# Patient Record
Sex: Male | Born: 1995 | Race: White | Hispanic: No | Marital: Single | Smoking: Current every day smoker
Health system: Southern US, Community
[De-identification: ages and names within clinical notes are randomized; demographics above are authoritative.]

## PROBLEM LIST (undated history)

## (undated) DIAGNOSIS — F419 Anxiety disorder, unspecified: Secondary | ICD-10-CM

## (undated) DIAGNOSIS — F909 Attention-deficit hyperactivity disorder, unspecified type: Secondary | ICD-10-CM

## (undated) DIAGNOSIS — F32A Depression, unspecified: Secondary | ICD-10-CM

## (undated) DIAGNOSIS — F329 Major depressive disorder, single episode, unspecified: Secondary | ICD-10-CM

## (undated) HISTORY — DX: Anxiety disorder, unspecified: F41.9

## (undated) HISTORY — PX: WISDOM TOOTH EXTRACTION: SHX21

## (undated) HISTORY — DX: Major depressive disorder, single episode, unspecified: F32.9

## (undated) HISTORY — DX: Depression, unspecified: F32.A

## (undated) HISTORY — PX: NO PAST SURGERIES: SHX2092

---
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
## 2016-10-12 ENCOUNTER — Encounter (HOSPITAL_COMMUNITY): Payer: Self-pay | Admitting: Emergency Medicine

## 2016-10-12 ENCOUNTER — Ambulatory Visit (HOSPITAL_COMMUNITY)
Admission: EM | Admit: 2016-10-12 | Discharge: 2016-10-12 | Disposition: A | Discharge status: Home / Self Care | Payer: BLUE CROSS/BLUE SHIELD | Attending: Family Medicine | Admitting: Family Medicine

## 2016-10-12 ENCOUNTER — Ambulatory Visit (INDEPENDENT_AMBULATORY_CARE_PROVIDER_SITE_OTHER): Payer: BLUE CROSS/BLUE SHIELD

## 2016-10-12 DIAGNOSIS — R0782 Intercostal pain: Secondary | ICD-10-CM

## 2016-10-12 DIAGNOSIS — S2231XA Fracture of one rib, right side, initial encounter for closed fracture: Secondary | ICD-10-CM | POA: Diagnosis not present

## 2016-10-12 DIAGNOSIS — R0789 Other chest pain: Secondary | ICD-10-CM

## 2016-10-12 HISTORY — DX: Other chest pain: R07.89

## 2016-10-12 MED ORDER — HYDROCODONE-ACETAMINOPHEN 5-325 MG PO TABS: 1.0000 | ORAL_TABLET | Freq: Four times a day (QID) | ORAL | 0 refills | Status: DC | PRN

## 2016-10-12 MED ORDER — DICLOFENAC SODIUM 75 MG PO TBEC: 75.0000 mg | DELAYED_RELEASE_TABLET | Freq: Two times a day (BID) | ORAL | 1 refills | Status: DC
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
## 2016-11-07 DIAGNOSIS — M62830 Muscle spasm of back: Secondary | ICD-10-CM | POA: Diagnosis not present

## 2016-11-07 DIAGNOSIS — M9902 Segmental and somatic dysfunction of thoracic region: Secondary | ICD-10-CM | POA: Diagnosis not present

## 2016-11-07 DIAGNOSIS — R51 Headache: Secondary | ICD-10-CM | POA: Diagnosis not present

## 2016-11-07 DIAGNOSIS — M9901 Segmental and somatic dysfunction of cervical region: Secondary | ICD-10-CM | POA: Diagnosis not present

## 2016-12-29 DIAGNOSIS — M9901 Segmental and somatic dysfunction of cervical region: Secondary | ICD-10-CM | POA: Diagnosis not present

## 2016-12-29 DIAGNOSIS — M9902 Segmental and somatic dysfunction of thoracic region: Secondary | ICD-10-CM | POA: Diagnosis not present

## 2016-12-29 DIAGNOSIS — R51 Headache: Secondary | ICD-10-CM | POA: Diagnosis not present

## 2016-12-29 DIAGNOSIS — M62830 Muscle spasm of back: Secondary | ICD-10-CM | POA: Diagnosis not present

## 2017-02-13 DIAGNOSIS — Z720 Tobacco use: Secondary | ICD-10-CM | POA: Diagnosis not present

## 2017-02-13 DIAGNOSIS — J309 Allergic rhinitis, unspecified: Secondary | ICD-10-CM | POA: Diagnosis not present

## 2017-06-23 DIAGNOSIS — J9801 Acute bronchospasm: Secondary | ICD-10-CM | POA: Diagnosis not present

## 2017-06-23 DIAGNOSIS — J069 Acute upper respiratory infection, unspecified: Secondary | ICD-10-CM | POA: Diagnosis not present

## 2017-08-02 DIAGNOSIS — R509 Fever, unspecified: Secondary | ICD-10-CM | POA: Diagnosis not present

## 2017-08-02 DIAGNOSIS — J029 Acute pharyngitis, unspecified: Secondary | ICD-10-CM | POA: Diagnosis not present

## 2017-08-28 DIAGNOSIS — Z113 Encounter for screening for infections with a predominantly sexual mode of transmission: Secondary | ICD-10-CM | POA: Diagnosis not present

## 2017-08-31 DIAGNOSIS — J020 Streptococcal pharyngitis: Secondary | ICD-10-CM | POA: Diagnosis not present

## 2018-03-09 DIAGNOSIS — F419 Anxiety disorder, unspecified: Secondary | ICD-10-CM | POA: Diagnosis not present

## 2018-03-09 DIAGNOSIS — F322 Major depressive disorder, single episode, severe without psychotic features: Secondary | ICD-10-CM | POA: Diagnosis not present

## 2018-03-09 DIAGNOSIS — Z23 Encounter for immunization: Secondary | ICD-10-CM | POA: Diagnosis not present

## 2018-05-04 DIAGNOSIS — Z113 Encounter for screening for infections with a predominantly sexual mode of transmission: Secondary | ICD-10-CM | POA: Diagnosis not present

## 2018-05-04 DIAGNOSIS — F322 Major depressive disorder, single episode, severe without psychotic features: Secondary | ICD-10-CM | POA: Diagnosis not present

## 2018-07-31 ENCOUNTER — Other Ambulatory Visit: Payer: Self-pay | Admitting: Physician Assistant

## 2018-07-31 ENCOUNTER — Ambulatory Visit
Admission: RE | Admit: 2018-07-31 | Discharge: 2018-07-31 | Disposition: A | Discharge status: Home / Self Care | Payer: BLUE CROSS/BLUE SHIELD | Source: Ambulatory Visit | Attending: Physician Assistant | Admitting: Physician Assistant

## 2018-07-31 DIAGNOSIS — R059 Cough, unspecified: Secondary | ICD-10-CM

## 2018-07-31 DIAGNOSIS — F419 Anxiety disorder, unspecified: Secondary | ICD-10-CM | POA: Diagnosis not present

## 2018-07-31 DIAGNOSIS — F329 Major depressive disorder, single episode, unspecified: Secondary | ICD-10-CM | POA: Diagnosis not present

## 2018-07-31 DIAGNOSIS — R05 Cough: Secondary | ICD-10-CM

## 2018-07-31 DIAGNOSIS — Z113 Encounter for screening for infections with a predominantly sexual mode of transmission: Secondary | ICD-10-CM | POA: Diagnosis not present

## 2018-10-02 DIAGNOSIS — F419 Anxiety disorder, unspecified: Secondary | ICD-10-CM | POA: Diagnosis not present

## 2018-11-13 DIAGNOSIS — M62830 Muscle spasm of back: Secondary | ICD-10-CM | POA: Diagnosis not present

## 2018-11-13 DIAGNOSIS — M9902 Segmental and somatic dysfunction of thoracic region: Secondary | ICD-10-CM | POA: Diagnosis not present

## 2018-11-13 DIAGNOSIS — R51 Headache: Secondary | ICD-10-CM | POA: Diagnosis not present

## 2018-11-13 DIAGNOSIS — M9901 Segmental and somatic dysfunction of cervical region: Secondary | ICD-10-CM | POA: Diagnosis not present

## 2018-11-16 DIAGNOSIS — M9902 Segmental and somatic dysfunction of thoracic region: Secondary | ICD-10-CM | POA: Diagnosis not present

## 2018-11-16 DIAGNOSIS — M9901 Segmental and somatic dysfunction of cervical region: Secondary | ICD-10-CM | POA: Diagnosis not present

## 2018-11-16 DIAGNOSIS — R51 Headache: Secondary | ICD-10-CM | POA: Diagnosis not present

## 2018-11-16 DIAGNOSIS — M62830 Muscle spasm of back: Secondary | ICD-10-CM | POA: Diagnosis not present

## 2018-11-20 DIAGNOSIS — M9902 Segmental and somatic dysfunction of thoracic region: Secondary | ICD-10-CM | POA: Diagnosis not present

## 2018-11-20 DIAGNOSIS — R51 Headache: Secondary | ICD-10-CM | POA: Diagnosis not present

## 2018-11-20 DIAGNOSIS — M9901 Segmental and somatic dysfunction of cervical region: Secondary | ICD-10-CM | POA: Diagnosis not present

## 2018-11-20 DIAGNOSIS — M62830 Muscle spasm of back: Secondary | ICD-10-CM | POA: Diagnosis not present

## 2019-01-28 DIAGNOSIS — F322 Major depressive disorder, single episode, severe without psychotic features: Secondary | ICD-10-CM | POA: Diagnosis not present

## 2019-01-28 DIAGNOSIS — Z720 Tobacco use: Secondary | ICD-10-CM | POA: Diagnosis not present

## 2019-01-28 DIAGNOSIS — J309 Allergic rhinitis, unspecified: Secondary | ICD-10-CM | POA: Diagnosis not present

## 2019-01-28 DIAGNOSIS — Z0000 Encounter for general adult medical examination without abnormal findings: Secondary | ICD-10-CM | POA: Diagnosis not present

## 2019-01-28 DIAGNOSIS — F419 Anxiety disorder, unspecified: Secondary | ICD-10-CM | POA: Diagnosis not present

## 2019-04-04 DIAGNOSIS — Z13220 Encounter for screening for lipoid disorders: Secondary | ICD-10-CM | POA: Diagnosis not present

## 2019-04-04 DIAGNOSIS — Z0000 Encounter for general adult medical examination without abnormal findings: Secondary | ICD-10-CM | POA: Diagnosis not present

## 2019-04-04 DIAGNOSIS — Z131 Encounter for screening for diabetes mellitus: Secondary | ICD-10-CM | POA: Diagnosis not present

## 2019-04-08 DIAGNOSIS — R41840 Attention and concentration deficit: Secondary | ICD-10-CM | POA: Diagnosis not present

## 2019-04-08 DIAGNOSIS — D239 Other benign neoplasm of skin, unspecified: Secondary | ICD-10-CM | POA: Diagnosis not present

## 2019-04-08 DIAGNOSIS — D1801 Hemangioma of skin and subcutaneous tissue: Secondary | ICD-10-CM | POA: Diagnosis not present

## 2019-04-15 ENCOUNTER — Other Ambulatory Visit: Payer: Self-pay

## 2019-04-15 DIAGNOSIS — Z20828 Contact with and (suspected) exposure to other viral communicable diseases: Secondary | ICD-10-CM | POA: Diagnosis not present

## 2019-04-15 DIAGNOSIS — Z20822 Contact with and (suspected) exposure to covid-19: Secondary | ICD-10-CM

## 2019-04-17 LAB — NOVEL CORONAVIRUS, NAA: SARS-CoV-2, NAA: NOT DETECTED

## 2019-07-06 IMAGING — CR DG CHEST 2V
2 series · 2 of 2 positions shown · non-contrast
Comparison: October 12, 2016

CLINICAL DATA: Coughing up blood and mucus.

EXAM:
CHEST - 2 VIEW

[w chest pa]
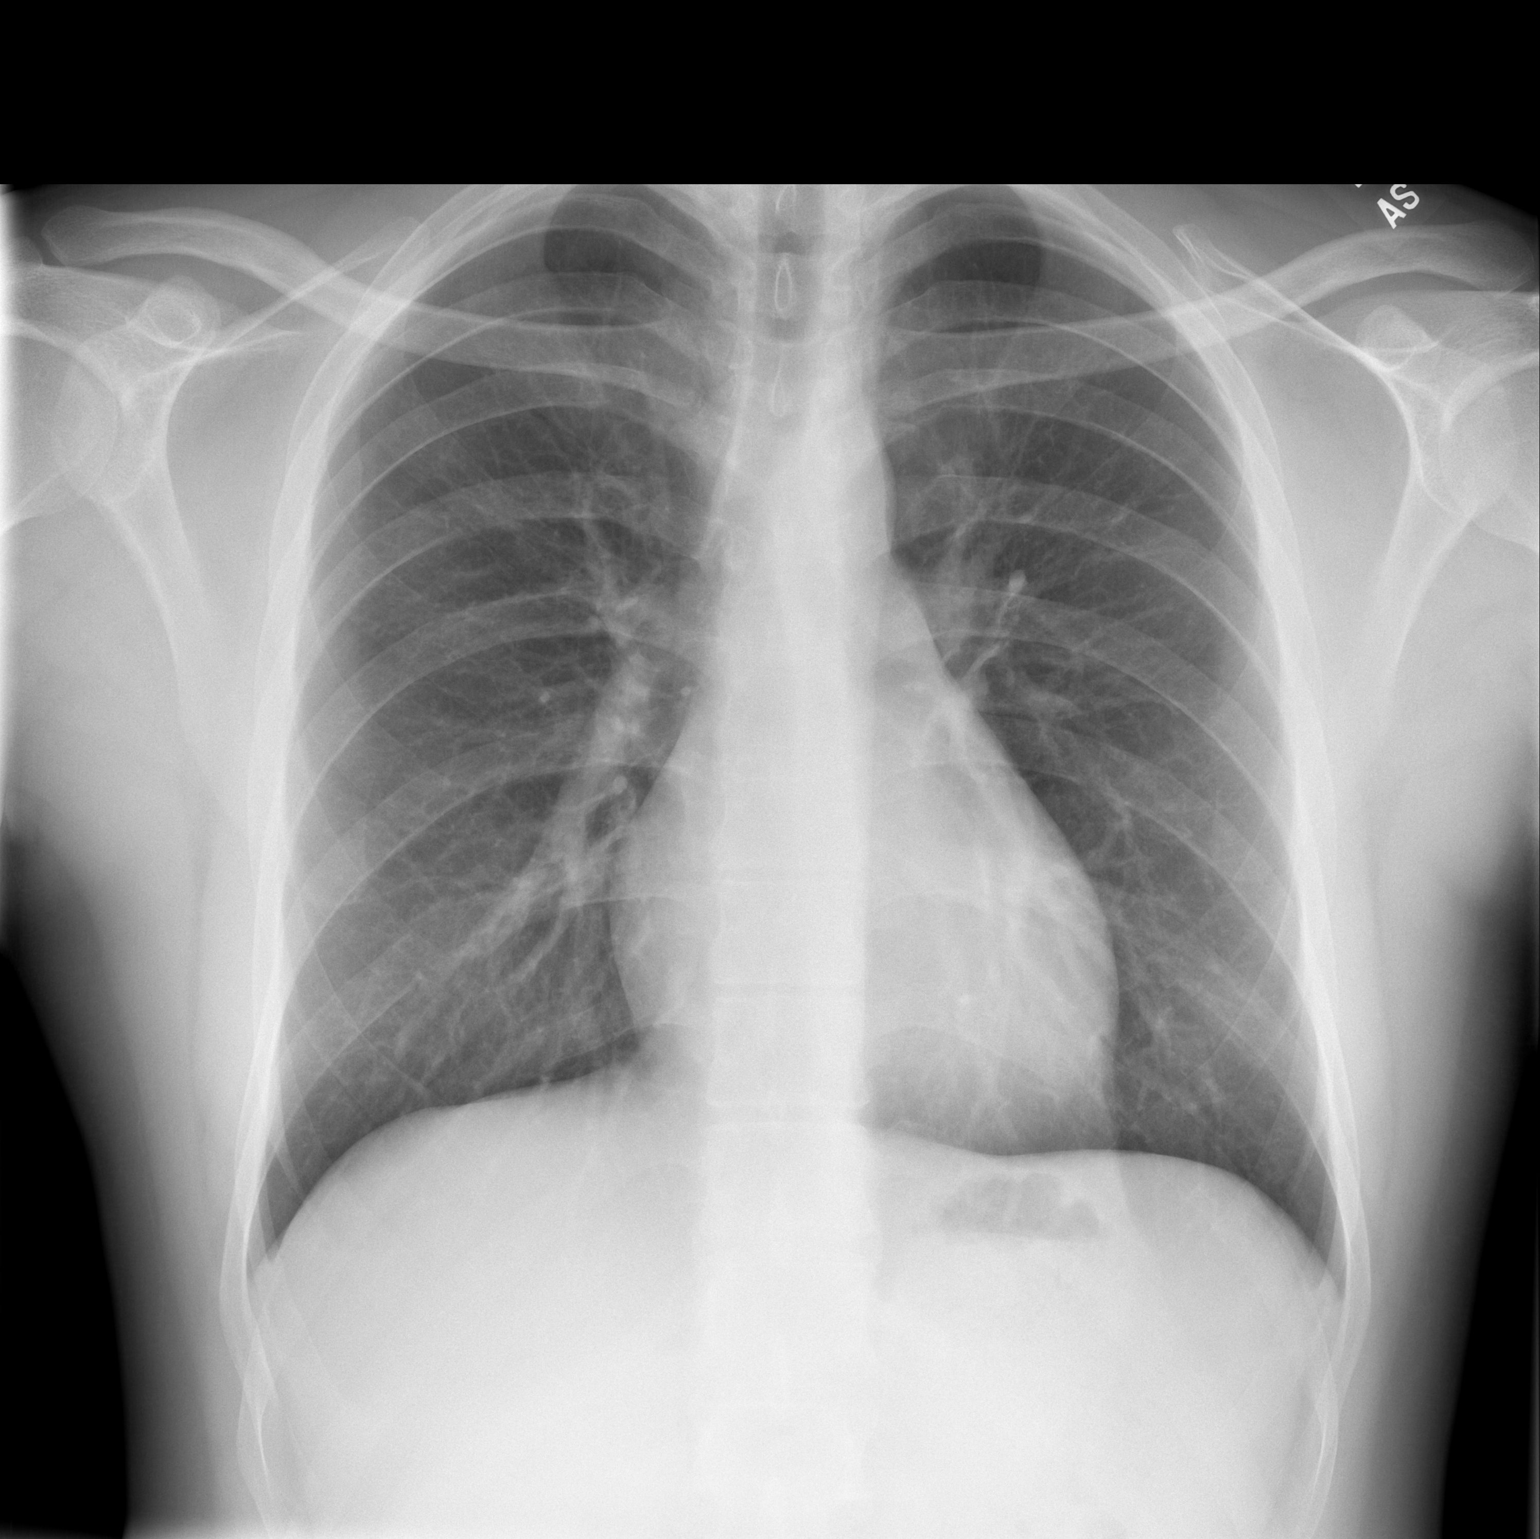

[w chest lat *]
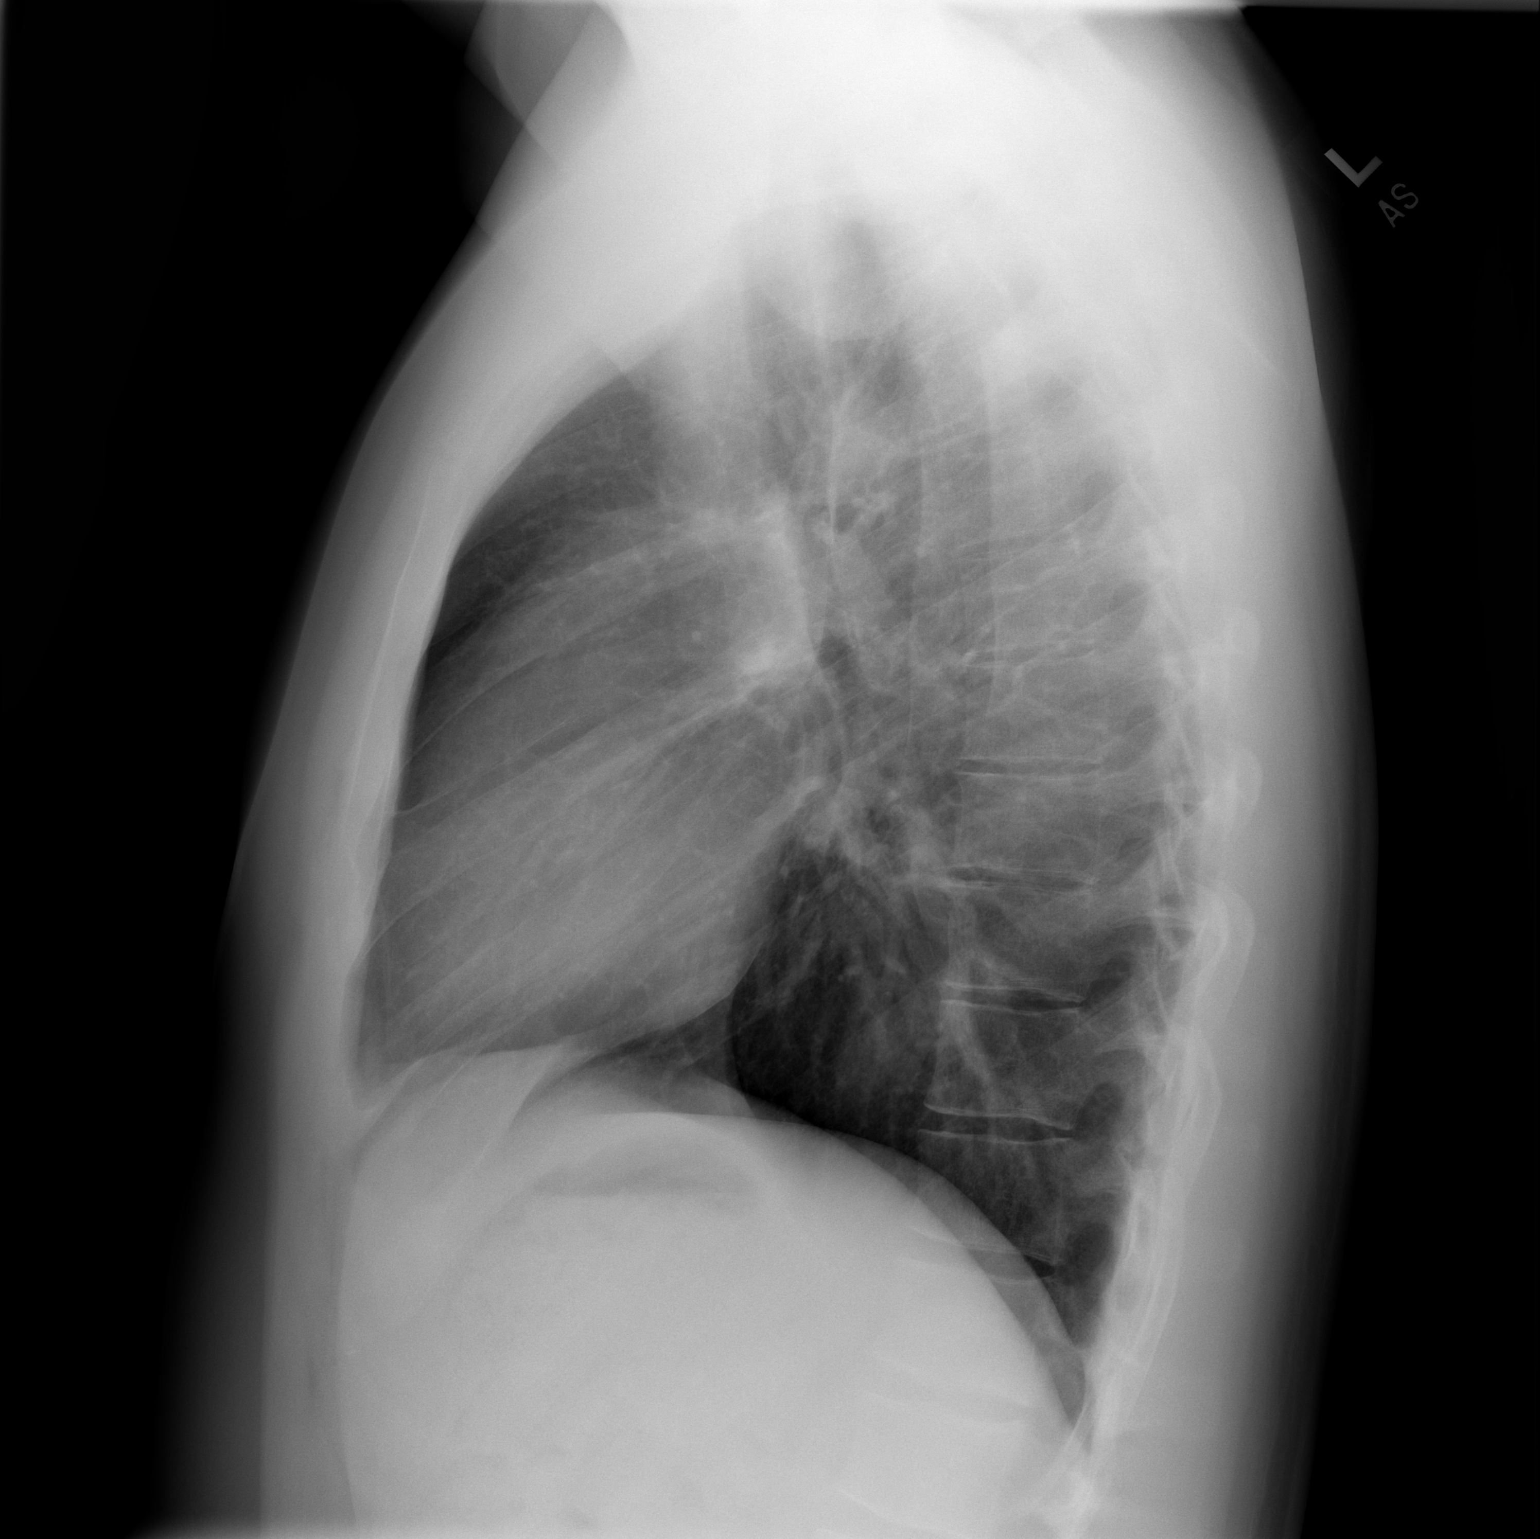

[2 of 2 positions shown; findings below may reference images not displayed]

FINDINGS: The heart size and mediastinal contours are within normal limits.
Both lungs are clear. The visualized skeletal structures are
unremarkable.
IMPRESSION: No active cardiopulmonary disease.

## 2019-07-23 DIAGNOSIS — Z20828 Contact with and (suspected) exposure to other viral communicable diseases: Secondary | ICD-10-CM | POA: Diagnosis not present

## 2019-07-23 DIAGNOSIS — Z03818 Encounter for observation for suspected exposure to other biological agents ruled out: Secondary | ICD-10-CM | POA: Diagnosis not present

## 2019-09-30 DIAGNOSIS — Z03818 Encounter for observation for suspected exposure to other biological agents ruled out: Secondary | ICD-10-CM | POA: Diagnosis not present

## 2019-09-30 DIAGNOSIS — Z20828 Contact with and (suspected) exposure to other viral communicable diseases: Secondary | ICD-10-CM | POA: Diagnosis not present

## 2019-11-28 DIAGNOSIS — F419 Anxiety disorder, unspecified: Secondary | ICD-10-CM | POA: Diagnosis not present

## 2020-03-31 DIAGNOSIS — Z03818 Encounter for observation for suspected exposure to other biological agents ruled out: Secondary | ICD-10-CM | POA: Diagnosis not present

## 2020-05-26 DIAGNOSIS — Z03818 Encounter for observation for suspected exposure to other biological agents ruled out: Secondary | ICD-10-CM | POA: Diagnosis not present

## 2020-05-29 DIAGNOSIS — Z0000 Encounter for general adult medical examination without abnormal findings: Secondary | ICD-10-CM | POA: Diagnosis not present

## 2020-05-29 DIAGNOSIS — Z23 Encounter for immunization: Secondary | ICD-10-CM | POA: Diagnosis not present

## 2020-05-29 DIAGNOSIS — F419 Anxiety disorder, unspecified: Secondary | ICD-10-CM | POA: Diagnosis not present

## 2020-05-29 DIAGNOSIS — F339 Major depressive disorder, recurrent, unspecified: Secondary | ICD-10-CM | POA: Diagnosis not present

## 2020-07-06 DIAGNOSIS — Z03818 Encounter for observation for suspected exposure to other biological agents ruled out: Secondary | ICD-10-CM | POA: Diagnosis not present

## 2020-07-06 DIAGNOSIS — Z20822 Contact with and (suspected) exposure to covid-19: Secondary | ICD-10-CM | POA: Diagnosis not present

## 2020-09-17 DIAGNOSIS — F411 Generalized anxiety disorder: Secondary | ICD-10-CM | POA: Diagnosis not present

## 2020-09-17 DIAGNOSIS — F902 Attention-deficit hyperactivity disorder, combined type: Secondary | ICD-10-CM | POA: Diagnosis not present

## 2020-09-17 DIAGNOSIS — F810 Specific reading disorder: Secondary | ICD-10-CM | POA: Diagnosis not present

## 2020-10-12 DIAGNOSIS — F810 Specific reading disorder: Secondary | ICD-10-CM | POA: Diagnosis not present

## 2020-10-12 DIAGNOSIS — F902 Attention-deficit hyperactivity disorder, combined type: Secondary | ICD-10-CM | POA: Diagnosis not present

## 2020-10-12 DIAGNOSIS — F411 Generalized anxiety disorder: Secondary | ICD-10-CM | POA: Diagnosis not present

## 2020-10-14 DIAGNOSIS — F902 Attention-deficit hyperactivity disorder, combined type: Secondary | ICD-10-CM | POA: Diagnosis not present

## 2020-10-14 DIAGNOSIS — F810 Specific reading disorder: Secondary | ICD-10-CM | POA: Diagnosis not present

## 2020-10-14 DIAGNOSIS — F411 Generalized anxiety disorder: Secondary | ICD-10-CM | POA: Diagnosis not present

## 2021-05-31 DIAGNOSIS — Z0000 Encounter for general adult medical examination without abnormal findings: Secondary | ICD-10-CM | POA: Diagnosis not present

## 2021-05-31 DIAGNOSIS — Z23 Encounter for immunization: Secondary | ICD-10-CM | POA: Diagnosis not present

## 2021-05-31 DIAGNOSIS — Z131 Encounter for screening for diabetes mellitus: Secondary | ICD-10-CM | POA: Diagnosis not present

## 2021-05-31 DIAGNOSIS — Z13220 Encounter for screening for lipoid disorders: Secondary | ICD-10-CM | POA: Diagnosis not present

## 2021-06-08 DIAGNOSIS — F810 Specific reading disorder: Secondary | ICD-10-CM | POA: Diagnosis not present

## 2021-06-08 DIAGNOSIS — F902 Attention-deficit hyperactivity disorder, combined type: Secondary | ICD-10-CM | POA: Diagnosis not present

## 2021-06-08 DIAGNOSIS — F411 Generalized anxiety disorder: Secondary | ICD-10-CM | POA: Diagnosis not present

## 2022-03-07 DIAGNOSIS — F418 Other specified anxiety disorders: Secondary | ICD-10-CM | POA: Diagnosis not present

## 2022-03-07 DIAGNOSIS — Z23 Encounter for immunization: Secondary | ICD-10-CM | POA: Diagnosis not present

## 2022-03-07 DIAGNOSIS — F900 Attention-deficit hyperactivity disorder, predominantly inattentive type: Secondary | ICD-10-CM | POA: Diagnosis not present

## 2022-03-07 DIAGNOSIS — L729 Follicular cyst of the skin and subcutaneous tissue, unspecified: Secondary | ICD-10-CM | POA: Diagnosis not present

## 2022-06-02 DIAGNOSIS — Z0000 Encounter for general adult medical examination without abnormal findings: Secondary | ICD-10-CM | POA: Diagnosis not present

## 2022-06-02 DIAGNOSIS — Z13220 Encounter for screening for lipoid disorders: Secondary | ICD-10-CM | POA: Diagnosis not present

## 2022-06-27 DIAGNOSIS — L0501 Pilonidal cyst with abscess: Secondary | ICD-10-CM

## 2022-06-27 HISTORY — DX: Pilonidal cyst with abscess: L05.01

## 2022-06-27 HISTORY — PX: INCISE AND DRAIN ABCESS: PRO64

## 2022-08-18 DIAGNOSIS — L0501 Pilonidal cyst with abscess: Secondary | ICD-10-CM | POA: Diagnosis not present

## 2022-08-18 DIAGNOSIS — L0591 Pilonidal cyst without abscess: Secondary | ICD-10-CM | POA: Diagnosis not present

## 2022-09-08 DIAGNOSIS — L0501 Pilonidal cyst with abscess: Secondary | ICD-10-CM | POA: Diagnosis not present

## 2022-09-08 DIAGNOSIS — Z9889 Other specified postprocedural states: Secondary | ICD-10-CM | POA: Diagnosis not present

## 2022-10-19 DIAGNOSIS — L0591 Pilonidal cyst without abscess: Secondary | ICD-10-CM | POA: Diagnosis not present

## 2022-11-07 ENCOUNTER — Ambulatory Visit: Payer: Self-pay | Admitting: Surgery

## 2022-11-07 ENCOUNTER — Encounter (HOSPITAL_BASED_OUTPATIENT_CLINIC_OR_DEPARTMENT_OTHER): Payer: Self-pay | Admitting: Surgery

## 2022-11-25 ENCOUNTER — Other Ambulatory Visit: Payer: Self-pay

## 2022-11-25 ENCOUNTER — Encounter (HOSPITAL_BASED_OUTPATIENT_CLINIC_OR_DEPARTMENT_OTHER): Payer: Self-pay | Admitting: Surgery

## 2022-11-25 NOTE — Progress Notes (Signed)
Spoke w/ via phone for pre-op interview---pt Lab needs dos----   none            Lab results------none COVID test -----patient states asymptomatic no test needed Arrive at -------630 am 12-14-2022 NPO after MN NO Solid Food.  Clear liquids from MN until---530 am Med rec completed Medications to take morning of surgery -----none Diabetic medication -----n/a Patient instructed no nail polish to be worn day of surgery Patient instructed to bring photo id and insurance card day of surgery Patient aware to have Driver (ride ) / caregiver   mother cydnee  for 24 hours after surgery  Patient Special Instructions -----no smoking 24 hours before surgery Pre-Op special Instructions -----none Patient verbalized understanding of instructions that were given at this phone interview. Patient denies shortness of breath, chest pain, fever, cough at this phone interview.

## 2022-12-13 DIAGNOSIS — F411 Generalized anxiety disorder: Secondary | ICD-10-CM | POA: Diagnosis not present

## 2022-12-13 DIAGNOSIS — F900 Attention-deficit hyperactivity disorder, predominantly inattentive type: Secondary | ICD-10-CM | POA: Diagnosis not present

## 2022-12-13 NOTE — Anesthesia Preprocedure Evaluation (Addendum)
Anesthesia Evaluation  Patient identified by MRN, date of birth, ID band Patient awake    Reviewed: Allergy & Precautions, NPO status , Patient's Chart, lab work & pertinent test results  Airway Mallampati: II  TM Distance: >3 FB Neck ROM: Full    Dental no notable dental hx.    Pulmonary Current Smoker and Patient abstained from smoking.   Pulmonary exam normal        Cardiovascular negative cardio ROS  Rhythm:Regular Rate:Normal     Neuro/Psych   Anxiety Depression    negative neurological ROS     GI/Hepatic Neg liver ROS,,,Pilonidal cyst    Endo/Other  negative endocrine ROS    Renal/GU negative Renal ROS  negative genitourinary   Musculoskeletal negative musculoskeletal ROS (+)    Abdominal Normal abdominal exam  (+)   Peds  (+) ADHD Hematology negative hematology ROS (+)   Anesthesia Other Findings   Reproductive/Obstetrics                             Anesthesia Physical Anesthesia Plan  ASA: 2  Anesthesia Plan: General   Post-op Pain Management: Celebrex PO (pre-op)* and Tylenol PO (pre-op)*   Induction: Intravenous  PONV Risk Score and Plan: 1 and Ondansetron, Dexamethasone, Midazolam and Treatment may vary due to age or medical condition  Airway Management Planned: Mask and Oral ETT  Additional Equipment: None  Intra-op Plan:   Post-operative Plan: Extubation in OR  Informed Consent: I have reviewed the patients History and Physical, chart, labs and discussed the procedure including the risks, benefits and alternatives for the proposed anesthesia with the patient or authorized representative who has indicated his/her understanding and acceptance.     Dental advisory given  Plan Discussed with: CRNA  Anesthesia Plan Comments:        Anesthesia Quick Evaluation

## 2022-12-14 ENCOUNTER — Ambulatory Visit (HOSPITAL_BASED_OUTPATIENT_CLINIC_OR_DEPARTMENT_OTHER): Payer: BC Managed Care – PPO | Admitting: Anesthesiology

## 2022-12-14 ENCOUNTER — Encounter (HOSPITAL_BASED_OUTPATIENT_CLINIC_OR_DEPARTMENT_OTHER): Payer: Self-pay | Admitting: Surgery

## 2022-12-14 ENCOUNTER — Other Ambulatory Visit: Payer: Self-pay

## 2022-12-14 ENCOUNTER — Ambulatory Visit (HOSPITAL_COMMUNITY)
Admission: RE | Admit: 2022-12-14 | Discharge: 2022-12-14 | Disposition: A | Discharge status: Home / Self Care | Payer: BC Managed Care – PPO | Attending: Surgery | Admitting: Surgery

## 2022-12-14 ENCOUNTER — Encounter (HOSPITAL_BASED_OUTPATIENT_CLINIC_OR_DEPARTMENT_OTHER)
Admission: RE | Disposition: A | Discharge status: Home / Self Care | Payer: Self-pay | Source: Home / Self Care | Attending: Surgery

## 2022-12-14 DIAGNOSIS — F419 Anxiety disorder, unspecified: Secondary | ICD-10-CM | POA: Diagnosis not present

## 2022-12-14 DIAGNOSIS — F909 Attention-deficit hyperactivity disorder, unspecified type: Secondary | ICD-10-CM | POA: Diagnosis not present

## 2022-12-14 DIAGNOSIS — F17200 Nicotine dependence, unspecified, uncomplicated: Secondary | ICD-10-CM | POA: Insufficient documentation

## 2022-12-14 DIAGNOSIS — F32A Depression, unspecified: Secondary | ICD-10-CM | POA: Insufficient documentation

## 2022-12-14 DIAGNOSIS — L0591 Pilonidal cyst without abscess: Secondary | ICD-10-CM | POA: Diagnosis not present

## 2022-12-14 DIAGNOSIS — Z01818 Encounter for other preprocedural examination: Secondary | ICD-10-CM

## 2022-12-14 HISTORY — PX: PILONIDAL CYST EXCISION: SHX744

## 2022-12-14 HISTORY — DX: Attention-deficit hyperactivity disorder, unspecified type: F90.9

## 2022-12-14 SURGERY — EXCISION, SIMPLE PILONIDAL CYST
Anesthesia: General | Site: Rectum

## 2022-12-14 MED ORDER — SUGAMMADEX SODIUM 200 MG/2ML IV SOLN
INTRAVENOUS | Status: DC | PRN
  Administered 2022-12-14: 200 mg via INTRAVENOUS

## 2022-12-14 MED ORDER — CHLORHEXIDINE GLUCONATE CLOTH 2 % EX PADS: 6.0000 | MEDICATED_PAD | Freq: Once | CUTANEOUS | Status: DC

## 2022-12-14 MED ORDER — BUPIVACAINE-EPINEPHRINE 0.25% -1:200000 IJ SOLN
INTRAMUSCULAR | Status: DC | PRN
  Administered 2022-12-14: 30 mL

## 2022-12-14 MED ORDER — ROCURONIUM BROMIDE 10 MG/ML (PF) SYRINGE
PREFILLED_SYRINGE | INTRAVENOUS | Status: DC | PRN
  Administered 2022-12-14: 50 mg via INTRAVENOUS

## 2022-12-14 MED ORDER — MIDAZOLAM HCL 5 MG/5ML IJ SOLN
INTRAMUSCULAR | Status: DC | PRN
  Administered 2022-12-14: 2 mg via INTRAVENOUS

## 2022-12-14 MED ORDER — LIDOCAINE 2% (20 MG/ML) 5 ML SYRINGE
INTRAMUSCULAR | Status: DC | PRN
  Administered 2022-12-14: 80 mg via INTRAVENOUS

## 2022-12-14 MED ORDER — "GAUZE DRESSING 4""X4"" PADS": 1.0000 | MEDICATED_PAD | Freq: Two times a day (BID) | 2 refills | Status: AC

## 2022-12-14 MED ORDER — LACTATED RINGERS IV SOLN: INTRAVENOUS | Status: DC

## 2022-12-14 MED ORDER — DEXMEDETOMIDINE HCL IN NACL 80 MCG/20ML IV SOLN
INTRAVENOUS | Status: DC | PRN
  Administered 2022-12-14 (×5): 4 ug via INTRAVENOUS

## 2022-12-14 MED ORDER — LIDOCAINE HCL (PF) 2 % IJ SOLN
INTRAMUSCULAR | Status: AC
  Filled 2022-12-14: qty 5

## 2022-12-14 MED ORDER — PROPOFOL 10 MG/ML IV BOLUS
INTRAVENOUS | Status: DC | PRN
  Administered 2022-12-14: 200 mg via INTRAVENOUS

## 2022-12-14 MED ORDER — MIDAZOLAM HCL 2 MG/2ML IJ SOLN
INTRAMUSCULAR | Status: AC
  Filled 2022-12-14: qty 2

## 2022-12-14 MED ORDER — DEXAMETHASONE SODIUM PHOSPHATE 10 MG/ML IJ SOLN
INTRAMUSCULAR | Status: DC | PRN
  Administered 2022-12-14: 10 mg via INTRAVENOUS

## 2022-12-14 MED ORDER — ACETAMINOPHEN 500 MG PO TABS
ORAL_TABLET | ORAL | Status: AC
  Filled 2022-12-14: qty 2

## 2022-12-14 MED ORDER — DEXMEDETOMIDINE HCL IN NACL 80 MCG/20ML IV SOLN
INTRAVENOUS | Status: AC
  Filled 2022-12-14: qty 20

## 2022-12-14 MED ORDER — ONDANSETRON HCL 4 MG/2ML IJ SOLN
INTRAMUSCULAR | Status: AC
  Filled 2022-12-14: qty 2

## 2022-12-14 MED ORDER — ONDANSETRON HCL 4 MG/2ML IJ SOLN
INTRAMUSCULAR | Status: DC | PRN
  Administered 2022-12-14: 4 mg via INTRAVENOUS

## 2022-12-14 MED ORDER — FENTANYL CITRATE (PF) 100 MCG/2ML IJ SOLN: 25.0000 ug | INTRAMUSCULAR | Status: DC | PRN

## 2022-12-14 MED ORDER — 0.9 % SODIUM CHLORIDE (POUR BTL) OPTIME
TOPICAL | Status: DC | PRN
  Administered 2022-12-14: 1000 mL

## 2022-12-14 MED ORDER — PROPOFOL 10 MG/ML IV BOLUS
INTRAVENOUS | Status: AC
  Filled 2022-12-14: qty 20

## 2022-12-14 MED ORDER — ACETAMINOPHEN 500 MG PO TABS
1000.0000 mg | ORAL_TABLET | ORAL | Status: AC
  Administered 2022-12-14: 1000 mg via ORAL

## 2022-12-14 MED ORDER — ROCURONIUM BROMIDE 10 MG/ML (PF) SYRINGE
PREFILLED_SYRINGE | INTRAVENOUS | Status: AC
  Filled 2022-12-14: qty 10

## 2022-12-14 MED ORDER — CEFAZOLIN SODIUM-DEXTROSE 2-4 GM/100ML-% IV SOLN
2.0000 g | INTRAVENOUS | Status: AC
  Administered 2022-12-14: 2 g via INTRAVENOUS

## 2022-12-14 MED ORDER — DEXAMETHASONE SODIUM PHOSPHATE 10 MG/ML IJ SOLN
INTRAMUSCULAR | Status: AC
  Filled 2022-12-14: qty 1

## 2022-12-14 MED ORDER — CELECOXIB 200 MG PO CAPS
ORAL_CAPSULE | ORAL | Status: AC
  Filled 2022-12-14: qty 1

## 2022-12-14 MED ORDER — CEFAZOLIN SODIUM-DEXTROSE 2-4 GM/100ML-% IV SOLN
INTRAVENOUS | Status: AC
  Filled 2022-12-14: qty 100

## 2022-12-14 MED ORDER — CELECOXIB 200 MG PO CAPS
200.0000 mg | ORAL_CAPSULE | ORAL | Status: AC
  Administered 2022-12-14: 200 mg via ORAL

## 2022-12-14 MED ORDER — OXYCODONE HCL 5 MG PO TABS: 5.0000 mg | ORAL_TABLET | Freq: Once | ORAL | Status: DC | PRN

## 2022-12-14 MED ORDER — TRAMADOL HCL 50 MG PO TABS: 50.0000 mg | ORAL_TABLET | Freq: Four times a day (QID) | ORAL | 0 refills | Status: AC | PRN

## 2022-12-14 MED ORDER — FENTANYL CITRATE (PF) 100 MCG/2ML IJ SOLN
INTRAMUSCULAR | Status: AC
  Filled 2022-12-14: qty 2

## 2022-12-14 MED ORDER — FENTANYL CITRATE (PF) 100 MCG/2ML IJ SOLN
INTRAMUSCULAR | Status: DC | PRN
  Administered 2022-12-14 (×2): 50 ug via INTRAVENOUS

## 2022-12-14 MED ORDER — OXYCODONE HCL 5 MG/5ML PO SOLN: 5.0000 mg | Freq: Once | ORAL | Status: DC | PRN

## 2022-12-14 SURGICAL SUPPLY — 45 items
APL SKNCLS STERI-STRIP NONHPOA (GAUZE/BANDAGES/DRESSINGS) ×1
BENZOIN TINCTURE PRP APPL 2/3 (GAUZE/BANDAGES/DRESSINGS) ×1 IMPLANT
BLADE EXTENDED COATED 6.5IN (ELECTRODE) IMPLANT
BLADE SURG 10 STRL SS (BLADE) IMPLANT
BLADE SURG 15 STRL LF DISP TIS (BLADE) ×1 IMPLANT
BLADE SURG 15 STRL SS (BLADE) ×1
BRIEF MESH DISP LRG (UNDERPADS AND DIAPERS) IMPLANT
COVER BACK TABLE 60X90IN (DRAPES) ×1 IMPLANT
COVER MAYO STAND STRL (DRAPES) ×1 IMPLANT
DRAIN PENROSE 0.25X18 (DRAIN) IMPLANT
DRAPE LAPAROTOMY 100X72 PEDS (DRAPES) ×1 IMPLANT
DRAPE UTILITY XL STRL (DRAPES) ×1 IMPLANT
ELECT BLADE TIP CTD 4 INCH (ELECTRODE) IMPLANT
ELECT NDL BLADE 2-5/6 (NEEDLE) ×1 IMPLANT
ELECT NEEDLE BLADE 2-5/6 (NEEDLE) ×1 IMPLANT
ELECT REM PT RETURN 9FT ADLT (ELECTROSURGICAL) ×1
ELECTRODE REM PT RTRN 9FT ADLT (ELECTROSURGICAL) ×1 IMPLANT
GAUZE 4X4 16PLY ~~LOC~~+RFID DBL (SPONGE) ×1 IMPLANT
GAUZE PAD ABD 8X10 STRL (GAUZE/BANDAGES/DRESSINGS) ×1 IMPLANT
GAUZE SPONGE 4X4 12PLY STRL (GAUZE/BANDAGES/DRESSINGS) ×1 IMPLANT
GAUZE VASELINE 3X9 (GAUZE/BANDAGES/DRESSINGS) IMPLANT
GLOVE BIO SURGEON STRL SZ 6 (GLOVE) ×1 IMPLANT
GLOVE BIOGEL PI IND STRL 6 (GLOVE) ×1 IMPLANT
GLOVE BIOGEL PI IND STRL 6.5 (GLOVE) ×1 IMPLANT
GLOVE SURG POLY MICRO LF SZ5.5 (GLOVE) ×1 IMPLANT
GOWN STRL REUS W/TWL LRG LVL3 (GOWN DISPOSABLE) ×1 IMPLANT
KIT TURNOVER CYSTO (KITS) ×1 IMPLANT
NDL HYPO 25X1 1.5 SAFETY (NEEDLE) ×1 IMPLANT
NDL SAFETY ECLIP 18X1.5 (MISCELLANEOUS) IMPLANT
NEEDLE HYPO 25X1 1.5 SAFETY (NEEDLE) ×1 IMPLANT
NS IRRIG 500ML POUR BTL (IV SOLUTION) ×1 IMPLANT
PACK BASIN DAY SURGERY FS (CUSTOM PROCEDURE TRAY) ×1 IMPLANT
PAD ARMBOARD 7.5X6 YLW CONV (MISCELLANEOUS) IMPLANT
PENCIL SMOKE EVACUATOR (MISCELLANEOUS) ×1 IMPLANT
SLEEVE SCD COMPRESS KNEE MED (STOCKING) ×1 IMPLANT
SPONGE SURGIFOAM ABS GEL 12-7 (HEMOSTASIS) IMPLANT
SUT CHROMIC 3 0 PS 2 (SUTURE) IMPLANT
SUT CHROMIC 3 0 SH 27 (SUTURE) ×1 IMPLANT
SUT VIC AB 2-0 SH 27 (SUTURE)
SUT VIC AB 2-0 SH 27XBRD (SUTURE) IMPLANT
SYR CONTROL 10ML LL (SYRINGE) ×1 IMPLANT
TOWEL OR 17X24 6PK STRL BLUE (TOWEL DISPOSABLE) ×2 IMPLANT
TRAY DSU PREP LF (CUSTOM PROCEDURE TRAY) ×1 IMPLANT
TUBE CONNECTING 12X1/4 (SUCTIONS) ×1 IMPLANT
YANKAUER SUCT BULB TIP NO VENT (SUCTIONS) ×1 IMPLANT

## 2022-12-14 NOTE — Transfer of Care (Signed)
Immediate Anesthesia Transfer of Care Note  Patient: Fernando Baird  Procedure(s) Performed: CYST EXCISION PILONIDAL SIMPLE (Rectum)  Patient Location: PACU  Anesthesia Type:General  Level of Consciousness: awake, alert , oriented, and sedated  Airway & Oxygen Therapy: Patient Spontanous Breathing and Patient connected to nasal cannula oxygen  Post-op Assessment: Report given to RN and Post -op Vital signs reviewed and stable  Post vital signs: Reviewed and stable  Last Vitals:  Vitals Value Taken Time  BP 125/66 12/14/22 0922  Temp 36.4 C 12/14/22 0921  Pulse 65 12/14/22 0924  Resp 14 12/14/22 0924  SpO2 100 % 12/14/22 0924  Vitals shown include unvalidated device data.  Last Pain:  Vitals:   12/14/22 0706  TempSrc: Oral  PainSc: 0-No pain      Patients Stated Pain Goal: 5 (12/14/22 0706)  Complications: No notable events documented.

## 2022-12-14 NOTE — H&P (Signed)
Fernando Baird is an 27 y.o. male.   Chief Complaint: pilonidal cyst HPI: Fernando Baird is a 27 y.o. male who is seen today for evaluation of a pilonidal cyst. He was initially seen in our office on 3/14 for drainage of a recurrent pilonidal abscess. He is here today to discuss definitive surgical excision. He first noticed the cyst about a year ago, and had a few episodes of spontaneous drainage. He has had two abscesses requiring I&D in the office, in February and again in March. In between flares he has minimal symptoms, but has recently noticed some discomfort in the area. He would like to have the cyst removed. He is here today for surgery.  He is otherwise in good health. He works for the [employer] and does a lot of driving.   Past Medical History:  Diagnosis Date   ADHD (attention deficit hyperactivity disorder)    Anxiety    Chest wall pain 10/12/2016   r/t closed fracture of one rib on the right side   Depression    Pilonidal cyst with abscess 2024    Past Surgical History:  Procedure Laterality Date   INCISE AND DRAIN ABCESS  2024   07/2022 & 08/2022 pilonidal cyst I & D / [facility]   WISDOM TOOTH EXTRACTION     age 36    Family History  Problem Relation Age of Onset   Depression Mother    Anxiety disorder Mother    Alcohol abuse Maternal Uncle    Depression Paternal Uncle    Suicidality Paternal Uncle    Social History:  reports that he has been smoking cigarettes. He has a 6.00 pack-year smoking history. He has never used smokeless tobacco. He reports that he does not currently use drugs. He reports that he does not drink alcohol.  Allergies: No Known Allergies  Medications Prior to Admission  Medication Sig Dispense Refill   Fexofenadine HCl (ALLEGRA PO) Take by mouth.     lisdexamfetamine (VYVANSE) 20 MG capsule Take 20 mg by mouth daily.      No results found for this or any previous visit (from the past 48 hour(s)). No results  found.  Review of Systems  Blood pressure 116/71, pulse 66, temperature 98.2 F (36.8 C), temperature source Oral, resp. rate 17, height 6\' 2"  (1.88 m), weight 96.3 kg, SpO2 99 %. Physical Exam Vitals reviewed.  Constitutional:      General: He is not in acute distress.    Appearance: Normal appearance.  HENT:     Head: Normocephalic and atraumatic.  Pulmonary:     Effort: Pulmonary effort is normal. No respiratory distress.  Musculoskeletal:        General: Normal range of motion.  Skin:    General: Skin is warm and dry.  Neurological:     General: No focal deficit present.     Mental Status: He is alert and oriented to person, place, and time.      Assessment/Plan 27 yo male with pilonidal disease. Proceed to the OR for excision. We reviewed the possibility of an open wound vs a drain postoperatively. Informed consent obtained. Plan for discharge home from PACU. All questions answered.  Fritzi Mandes, MD 12/14/2022, 8:30 AM

## 2022-12-14 NOTE — Discharge Instructions (Addendum)
Post Anesthesia Home Care Instructions  Activity: Get plenty of rest for the remainder of the day. A responsible individual must stay with you for 24 hours following the procedure.  For the next 24 hours, DO NOT: -Drive a car -Advertising copywriter -Drink alcoholic beverages -Take any medication unless instructed by your physician -Make any legal decisions or sign important papers.  Meals: Start with liquid foods such as gelatin or soup. Progress to regular foods as tolerated. Avoid greasy, spicy, heavy foods. If nausea and/or vomiting occur, drink only clear liquids until the nausea and/or vomiting subsides. Call your physician if vomiting continues.  Special Instructions/Symptoms: Your throat may feel dry or sore from the anesthesia or the breathing tube placed in your throat during surgery. If this causes discomfort, gargle with warm salt water. The discomfort should disappear within 24 hours.  If you had a scopolamine patch placed behind your ear for the management of post- operative nausea and/or vomiting:  1. The medication in the patch is effective for 72 hours, after which it should be removed.  Wrap patch in a tissue and discard in the trash. Wash hands thoroughly with soap and water. 2. You may remove the patch earlier than 72 hours if you experience unpleasant side effects which may include dry mouth, dizziness or visual disturbances. 3. Avoid touching the patch. Wash your hands with soap and water after contact with the patch.    No ibuprofen, Advil, Aleve, Motrin, ketorolac, meloxicam, naproxen, or other NSAIDS until after 1:10 pm today if needed. No acetaminophen/Tylenol until after 1:10 pm today if needed.  [facility] DISCHARGE INSTRUCTIONS   Activity Do not drive while taking narcotic pain medication. You should shower over your wound at least daily to help keep it clean.   Wound Care You may shower and allow warm soapy water to run over your wound.   Gently pat dry when done and replace dressing. Do not submerge your wound  underwater. Dressing changes: Cover your wound with dry gauze, and change at least twice daily and more often as needed if gauze is saturated. Monitor your incision for any new redness, tenderness, or foul-smelling drainage. Clear reddish-yellow drainage is normal and expected. If you have cloudy or foul-smelling drainage, please call the office.   When to Call us: Fever greater than 100.5 New redness, drainage, or swelling at incision site Severe pain, nausea, or vomiting     Follow-up You have an appointment scheduled with Dr. Freida Busman on January 06, 2023 at 9:20am. This will be at the [facility] office at [address], [address], [place], [place]. Please arrive at least 15 minutes prior to your scheduled appointment time.   For questions or concerns, please call the office at [phone].      Managing Your Pain After Surgery Without Opioids    Thank you for participating in our program to help patients manage their pain after surgery without opioids. This is part of our effort to provide you with the best care possible, without exposing you or your family to the risk that opioids pose.  What pain can I expect after surgery? You can expect to have some pain after surgery. This is normal. The pain is typically worse the day after surgery, and quickly begins to get better. Many studies have found that many patients are able to manage their pain after surgery with Over-the-Counter (OTC) medications such as Tylenol and Motrin. If you have a condition that does not allow  you to take Tylenol or Motrin, notify your surgical team.  How will I manage my pain? The best strategy for controlling your pain after surgery is around the clock pain control with Tylenol (acetaminophen) and Motrin (ibuprofen or Advil). Alternating these medications with each other allows you to maximize your pain control. In  addition to Tylenol and Motrin, you can use heating pads or ice packs on your incisions to help reduce your pain.  How will I alternate your regular strength over-the-counter pain medication? You will take a dose of pain medication every three hours. Start by taking 650 mg of Tylenol (2 pills of 325 mg) 3 hours later take 600 mg of Motrin (3 pills of 200 mg) 3 hours after taking the Motrin take 650 mg of Tylenol 3 hours after that take 600 mg of Motrin.   - 1 -  See example - if your first dose of Tylenol is at 12:00 PM   12:00 PM Tylenol 650 mg (2 pills of 325 mg)  3:00 PM Motrin 600 mg (3 pills of 200 mg)  6:00 PM Tylenol 650 mg (2 pills of 325 mg)  9:00 PM Motrin 600 mg (3 pills of 200 mg)  Continue alternating every 3 hours   We recommend that you follow this schedule around-the-clock for at least 3 days after surgery, or until you feel that it is no longer needed. Use the table on the last page of this handout to keep track of the medications you are taking. Important: Do not take more than 3000mg  of Tylenol or 3200mg  of Motrin in a 24-hour period. Do not take ibuprofen/Motrin if you have a history of bleeding stomach ulcers, severe kidney disease, &/or actively taking a blood thinner  What if I still have pain? If you have pain that is not controlled with the over-the-counter pain medications (Tylenol and Motrin or Advil) you might have what we call "breakthrough" pain. You will receive a prescription for a small amount of an opioid pain medication such as Oxycodone, Tramadol, or Tylenol with Codeine. Use these opioid pills in the first 24 hours after surgery if you have breakthrough pain. Do not take more than 1 pill every 4-6 hours.  If you still have uncontrolled pain after using all opioid pills, don't hesitate to call our staff using the number provided. We will help make sure you are managing your pain in the best way possible, and if necessary, we can provide a prescription  for additional pain medication.   Day 1    Time  Name of Medication Number of pills taken  Amount of Acetaminophen  Pain Level   Comments  AM PM       AM PM       AM PM       AM PM       AM PM       AM PM       AM PM       AM PM       Total Daily amount of Acetaminophen Do not take more than  3,000 mg per day      Day 2    Time  Name of Medication Number of pills taken  Amount of Acetaminophen  Pain Level   Comments  AM PM       AM PM       AM PM       AM PM       AM PM  AM PM       AM PM       AM PM       Total Daily amount of Acetaminophen Do not take more than  3,000 mg per day      Day 3    Time  Name of Medication Number of pills taken  Amount of Acetaminophen  Pain Level   Comments  AM PM       AM PM       AM PM       AM PM         AM PM       AM PM       AM PM       AM PM       Total Daily amount of Acetaminophen Do not take more than  3,000 mg per day      Day 4    Time  Name of Medication Number of pills taken  Amount of Acetaminophen  Pain Level   Comments  AM PM       AM PM       AM PM       AM PM       AM PM       AM PM       AM PM       AM PM       Total Daily amount of Acetaminophen Do not take more than  3,000 mg per day      Day 5    Time  Name of Medication Number of pills taken  Amount of Acetaminophen  Pain Level   Comments  AM PM       AM PM       AM PM       AM PM       AM PM       AM PM       AM PM       AM PM       Total Daily amount of Acetaminophen Do not take more than  3,000 mg per day      Day 6    Time  Name of Medication Number of pills taken  Amount of Acetaminophen  Pain Level  Comments  AM PM       AM PM       AM PM       AM PM       AM PM       AM PM       AM PM       AM PM       Total Daily amount of Acetaminophen Do not take more than  3,000 mg per day      Day 7    Time  Name of Medication Number of pills taken  Amount of Acetaminophen  Pain  Level   Comments  AM PM       AM PM       AM PM       AM PM       AM PM       AM PM       AM PM       AM PM       Total Daily amount of Acetaminophen Do not take more than  3,000 mg per day        For additional information about how and where  to safely dispose of unused opioid medications - [employer]  Disclaimer: This document contains information and/or instructional materials adapted from [facility] for the typical patient with your condition. It does not replace medical advice from your health care provider because your experience may differ from that of the typical patient. Talk to your health care provider if you have any questions about this document, your condition or your treatment plan. Adapted from [facility]

## 2022-12-14 NOTE — Anesthesia Procedure Notes (Signed)
Procedure Name: Intubation Date/Time: 12/14/2022 8:43 AM  Performed by: Rosemaria Inabinet D, CRNAPre-anesthesia Checklist: Patient identified, Emergency Drugs available, Suction available and Patient being monitored Patient Re-evaluated:Patient Re-evaluated prior to induction Oxygen Delivery Method: Circle system utilized Preoxygenation: Pre-oxygenation with 100% oxygen Induction Type: IV induction Ventilation: Mask ventilation without difficulty Laryngoscope Size: Mac and 4 Grade View: Grade I Tube type: Oral Tube size: 7.5 mm Number of attempts: 1 Airway Equipment and Method: Stylet and Oral airway Placement Confirmation: ETT inserted through vocal cords under direct vision, positive ETCO2 and breath sounds checked- equal and bilateral Secured at: 23 cm Tube secured with: Tape Dental Injury: Teeth and Oropharynx as per pre-operative assessment

## 2022-12-14 NOTE — Anesthesia Postprocedure Evaluation (Signed)
Anesthesia Post Note  Patient: Fernando Baird  Procedure(s) Performed: CYST EXCISION PILONIDAL SIMPLE (Rectum)     Patient location during evaluation: PACU Anesthesia Type: General Level of consciousness: awake and alert Pain management: pain level controlled Vital Signs Assessment: post-procedure vital signs reviewed and stable Respiratory status: spontaneous breathing, nonlabored ventilation, respiratory function stable and patient connected to nasal cannula oxygen Cardiovascular status: blood pressure returned to baseline and stable Postop Assessment: no apparent nausea or vomiting Anesthetic complications: no   No notable events documented.  Last Vitals:  Vitals:   12/14/22 0945 12/14/22 1015  BP: 116/64 119/68  Pulse: 64 67  Resp: 19 16  Temp: 36.5 C 36.5 C  SpO2: 100% 100%    Last Pain:  Vitals:   12/14/22 1015  TempSrc:   PainSc: 0-No pain                 Earl Lites P Shulamis Wenberg

## 2022-12-14 NOTE — Op Note (Signed)
Date: 12/14/22  Patient: Fernando Baird MRN: [id number]  Preoperative Diagnosis: Pilonidal cyst Postoperative Diagnosis: Same  Procedure: Trephination of pilonidal cyst  Surgeon: Sophronia Simas, MD  EBL: Minimal  Anesthesia: General  Specimens: None  Indications: [name] is a 27 yo male who presented with pilonidal disease. He has had multiple previous abscesses, most recently in March. After a discussion of the treatment options, he agreed to proceed with surgery.  Findings: Pilonidal disease with no active inflammation or abscess.  Procedure details: Informed consent was obtained in the preoperative area prior to the procedure. The patient was brought to the operating room, general anesthesia was induced, and perioperative antibiotics were administered per SCIP guidelines. The patient was placed in the prone position and was prepped and draped in the usual sterile fashion. A pre-procedure timeout was taken verifying patient identity, surgical site and procedure to be performed.  There were multiple small pits in the superior gluteal cleft, with no erythema, induration or fluctuance. A small elliptical skin incision was made to include all the pits, and the skin ellipse was excised with cautery to unroof the cyst. The underlying cavity was probed and curretted. There was no hair or debris within the cavity, and no residual inflammatory tissue noted. The wound was thoroughly irrigated with diluted peroxide solution and hemostasis was achieved with cautery. The wound was left open to heal by secondary intention and a dry gauze dressing was placed.  The patient tolerated the procedure well with no apparent complications. All counts were correct x2 at the end of the procedure. The patient was extubated and taken to PACU in stable condition.  Sophronia Simas, MD 12/14/22 9:24 AM

## 2022-12-15 ENCOUNTER — Encounter (HOSPITAL_BASED_OUTPATIENT_CLINIC_OR_DEPARTMENT_OTHER): Payer: Self-pay | Admitting: Surgery

## 2023-01-16 DIAGNOSIS — Z9889 Other specified postprocedural states: Secondary | ICD-10-CM | POA: Diagnosis not present

## 2023-01-16 DIAGNOSIS — Z09 Encounter for follow-up examination after completed treatment for conditions other than malignant neoplasm: Secondary | ICD-10-CM | POA: Diagnosis not present

## 2023-11-29 DIAGNOSIS — F419 Anxiety disorder, unspecified: Secondary | ICD-10-CM | POA: Diagnosis not present

## 2023-11-29 DIAGNOSIS — F17200 Nicotine dependence, unspecified, uncomplicated: Secondary | ICD-10-CM | POA: Diagnosis not present

## 2023-11-29 DIAGNOSIS — F339 Major depressive disorder, recurrent, unspecified: Secondary | ICD-10-CM | POA: Diagnosis not present

## 2023-11-29 DIAGNOSIS — F900 Attention-deficit hyperactivity disorder, predominantly inattentive type: Secondary | ICD-10-CM | POA: Diagnosis not present

## 2023-11-29 DIAGNOSIS — Z0000 Encounter for general adult medical examination without abnormal findings: Secondary | ICD-10-CM | POA: Diagnosis not present

## 2024-02-07 DIAGNOSIS — F339 Major depressive disorder, recurrent, unspecified: Secondary | ICD-10-CM | POA: Diagnosis not present

## 2024-02-07 DIAGNOSIS — F411 Generalized anxiety disorder: Secondary | ICD-10-CM | POA: Diagnosis not present

## 2024-02-07 DIAGNOSIS — F17210 Nicotine dependence, cigarettes, uncomplicated: Secondary | ICD-10-CM | POA: Diagnosis not present

## 2024-02-07 DIAGNOSIS — F900 Attention-deficit hyperactivity disorder, predominantly inattentive type: Secondary | ICD-10-CM | POA: Diagnosis not present

## 2024-04-18 DIAGNOSIS — F900 Attention-deficit hyperactivity disorder, predominantly inattentive type: Secondary | ICD-10-CM | POA: Diagnosis not present

## 2024-04-18 DIAGNOSIS — F411 Generalized anxiety disorder: Secondary | ICD-10-CM | POA: Diagnosis not present

## 2024-04-18 DIAGNOSIS — Z23 Encounter for immunization: Secondary | ICD-10-CM | POA: Diagnosis not present

## 2024-04-18 DIAGNOSIS — F339 Major depressive disorder, recurrent, unspecified: Secondary | ICD-10-CM | POA: Diagnosis not present
